# Patient Record
Sex: Male | Born: 1965 | Race: Black or African American | Hispanic: No | Smoking: Never smoker
Health system: Southern US, Community
[De-identification: ages and names within clinical notes are randomized; demographics above are authoritative.]

## PROBLEM LIST (undated history)

## (undated) DIAGNOSIS — R011 Cardiac murmur, unspecified: Secondary | ICD-10-CM

## (undated) HISTORY — PX: TONSILLECTOMY: SUR1361

---

## 2004-05-14 ENCOUNTER — Emergency Department: Payer: Self-pay | Admitting: Emergency Medicine

## 2005-03-27 ENCOUNTER — Other Ambulatory Visit: Payer: Self-pay

## 2005-03-27 ENCOUNTER — Emergency Department: Payer: Self-pay | Admitting: Unknown Physician Specialty

## 2006-12-26 ENCOUNTER — Emergency Department: Payer: Self-pay | Admitting: Emergency Medicine

## 2009-06-19 ENCOUNTER — Emergency Department: Payer: Self-pay | Admitting: Internal Medicine

## 2012-10-23 ENCOUNTER — Emergency Department: Payer: Self-pay | Admitting: Emergency Medicine

## 2012-10-23 LAB — SYNOVIAL CELL COUNT + DIFF, W/ CRYSTALS
Basophil: 0 %
Crystals, Joint Fluid: NONE SEEN
Neutrophils: 24 %
Other Mononuclear Cells: 53 %

## 2016-01-10 ENCOUNTER — Emergency Department: Payer: Self-pay

## 2016-01-10 ENCOUNTER — Emergency Department
Admission: EM | Admit: 2016-01-10 | Discharge: 2016-01-10 | Disposition: A | Discharge status: Home / Self Care | Payer: Self-pay | Attending: Emergency Medicine | Admitting: Emergency Medicine

## 2016-01-10 ENCOUNTER — Encounter: Payer: Self-pay | Admitting: Emergency Medicine

## 2016-01-10 DIAGNOSIS — R202 Paresthesia of skin: Secondary | ICD-10-CM | POA: Insufficient documentation

## 2016-01-10 DIAGNOSIS — R079 Chest pain, unspecified: Secondary | ICD-10-CM | POA: Insufficient documentation

## 2016-01-10 HISTORY — DX: Cardiac murmur, unspecified: R01.1

## 2016-01-10 LAB — BASIC METABOLIC PANEL
ANION GAP: 7 (ref 5–15)
BUN: 9 mg/dL (ref 6–20)
CHLORIDE: 103 mmol/L (ref 101–111)
CO2: 26 mmol/L (ref 22–32)
Calcium: 9 mg/dL (ref 8.9–10.3)
Creatinine, Ser: 1.1 mg/dL (ref 0.61–1.24)
GFR calc Af Amer: >60 mL/min (ref >60)
Glucose, Bld: 98 mg/dL (ref 65–99)
POTASSIUM: 3.6 mmol/L (ref 3.5–5.1)
SODIUM: 136 mmol/L (ref 135–145)

## 2016-01-10 LAB — CBC
HEMATOCRIT: 46.2 % (ref 40.0–52.0)
HEMOGLOBIN: 15.6 g/dL (ref 13.0–18.0)
MCH: 31.3 pg (ref 26.0–34.0)
MCHC: 33.7 g/dL (ref 32.0–36.0)
MCV: 92.9 fL (ref 80.0–100.0)
Platelets: 186 10*3/uL (ref 150–440)
RBC: 4.98 MIL/uL (ref 4.40–5.90)
RDW: 13.4 % (ref 11.5–14.5)
WBC: 4.2 10*3/uL (ref 3.8–10.6)

## 2016-01-10 LAB — TROPONIN I: Troponin I: <0.03 ng/mL (ref <0.03)

## 2016-01-10 NOTE — ED Triage Notes (Signed)
Pt presents to ED with reports of chest pain for approximately one week that radiates to his back. Pt reports left arm numbness began approximately three weeks ago. Pt denies nausea and vomiting. Pt in no apparent distress in triage.

## 2016-01-10 NOTE — ED Provider Notes (Signed)
[facility] Emergency Department Provider Note   ____________________________________________    I have reviewed the triage vital signs and the nursing notes.   HISTORY  Chief Complaint Chest Pain and Arm numbness     HPI Logan Berry is a 50 y.o. male who presents with complaints of chest pressure for approximately month and intermittent tingling in his left arm for the last week. Currently he has no chest pain or tingling. He feels well today. He denies a history of heart disease. He denies shortness of breath. No calf pain or swelling. No pleurisy. No fevers or chills.   Past Medical History:  Diagnosis Date  . Heart murmur     There are no active problems to display for this patient.   Past Surgical History:  Procedure Laterality Date  . TONSILLECTOMY      Prior to Admission medications   Not on File     Allergies Penicillins  No family history on file.  Social History Social History  Substance Use Topics  . Smoking status: Never Smoker  . Smokeless tobacco: Never Used  . Alcohol use No    Review of Systems  Constitutional: No fever/chills Eyes: No visual changes.  ENT: No sore throat. Cardiovascular: As above Respiratory: Denies shortness of breath. Gastrointestinal: No abdominal pain.  No nausea, no vomiting.   Genitourinary: Negative for dysuria. Musculoskeletal: Negative for back pain. Skin: Negative for rash. Neurological: Negative for headaches or weakness  10-point ROS otherwise negative.  ____________________________________________   PHYSICAL EXAM:  VITAL SIGNS: ED Triage Vitals  Enc Vitals Group     BP 01/10/16 1223 (!) 128/93     Pulse Rate 01/10/16 1223 63     Resp 01/10/16 1223 18     Temp 01/10/16 1223 98.2 F (36.8 C)     Temp Source 01/10/16 1223 Oral     SpO2 01/10/16 1223 100 %     Weight 01/10/16 1222 150 lb (68 kg)     Height 01/10/16 1222 5\' 7"  (1.702 m)     Head Circumference --       Peak Flow --      Pain Score 01/10/16 1222 3     Pain Loc --      Pain Edu? --      Excl. in GC? --     Constitutional: Alert and oriented. No acute distress. Pleasant and interactive Eyes: Conjunctivae are normal.   Nose: No congestion/rhinnorhea. Mouth/Throat: Mucous membranes are moist.    Cardiovascular: Normal rate, regular rhythm. Grossly normal heart sounds.  Good peripheral circulation. Respiratory: Normal respiratory effort.  No retractions. Lungs CTAB. Gastrointestinal: Soft and nontender. No distention.  No CVA tenderness. Genitourinary: deferred Musculoskeletal: No lower extremity tenderness nor edema.  Warm and well perfused Neurologic:  Normal speech and language. No gross focal neurologic deficits are appreciated.  Skin:  Skin is warm, dry and intact. No rash noted. Psychiatric: Mood and affect are normal. Speech and behavior are normal.  ____________________________________________   LABS (all labs ordered are listed, but only abnormal results are displayed)  Labs Reviewed  BASIC METABOLIC PANEL  CBC  TROPONIN I   ____________________________________________  EKG  ED ECG REPORT I, Jene EveryKINNER, Casy Brunetto, the attending physician, personally viewed and interpreted this ECG.  Date: 01/10/2016 EKG Time: 12:21 PM Rate: 61 Rhythm: normal sinus rhythm QRS Axis: normal Intervals: normal ST/T Wave abnormalities: normal Conduction Disturbances: none Narrative Interpretation: unremarkable  ____________________________________________  RADIOLOGY  Chest x-ray unremarkable ____________________________________________  PROCEDURES  Procedure(s) performed: No    Critical Care performed: No ____________________________________________   INITIAL IMPRESSION / ASSESSMENT AND PLAN / ED COURSE  Pertinent labs & imaging results that were available during my care of the patient were reviewed by me and considered in my medical decision making (see chart for  details).  Patient with intermittent chest pain over the last month. He is chest pain-free here. His EKG and lab work is reassuring. However certainly patient could be having angina and we discussed how he needs close cardiology follow-up. He is to refrain from strenuous activity until cleared by cardiology. He will take an aspirin per day. He knows to return to the ED if any worsening pain at all.  Clinical Course   ____________________________________________   FINAL CLINICAL IMPRESSION(S) / ED DIAGNOSES  Final diagnoses:  Nonspecific chest pain      NEW MEDICATIONS STARTED DURING THIS VISIT:  There are no discharge medications for this patient.    Note:  This document was prepared using Dragon voice recognition software and may include unintentional dictation errors.    Jene Everyobert Shaelin Lalley, MD 01/10/16 [phone]

## 2017-02-13 IMAGING — CR DG CHEST 2V
1 series · 2 of 2 positions shown · non-contrast
Comparison: 03/27/2005

CLINICAL DATA: Chest tightness for 1 week, right arm numbness for 3
weeks

EXAM:
CHEST  2 VIEW

[Series 1: dg chest 2 view · 0.14mm/px · 2 of 2 slices shown]
[im 1/2]
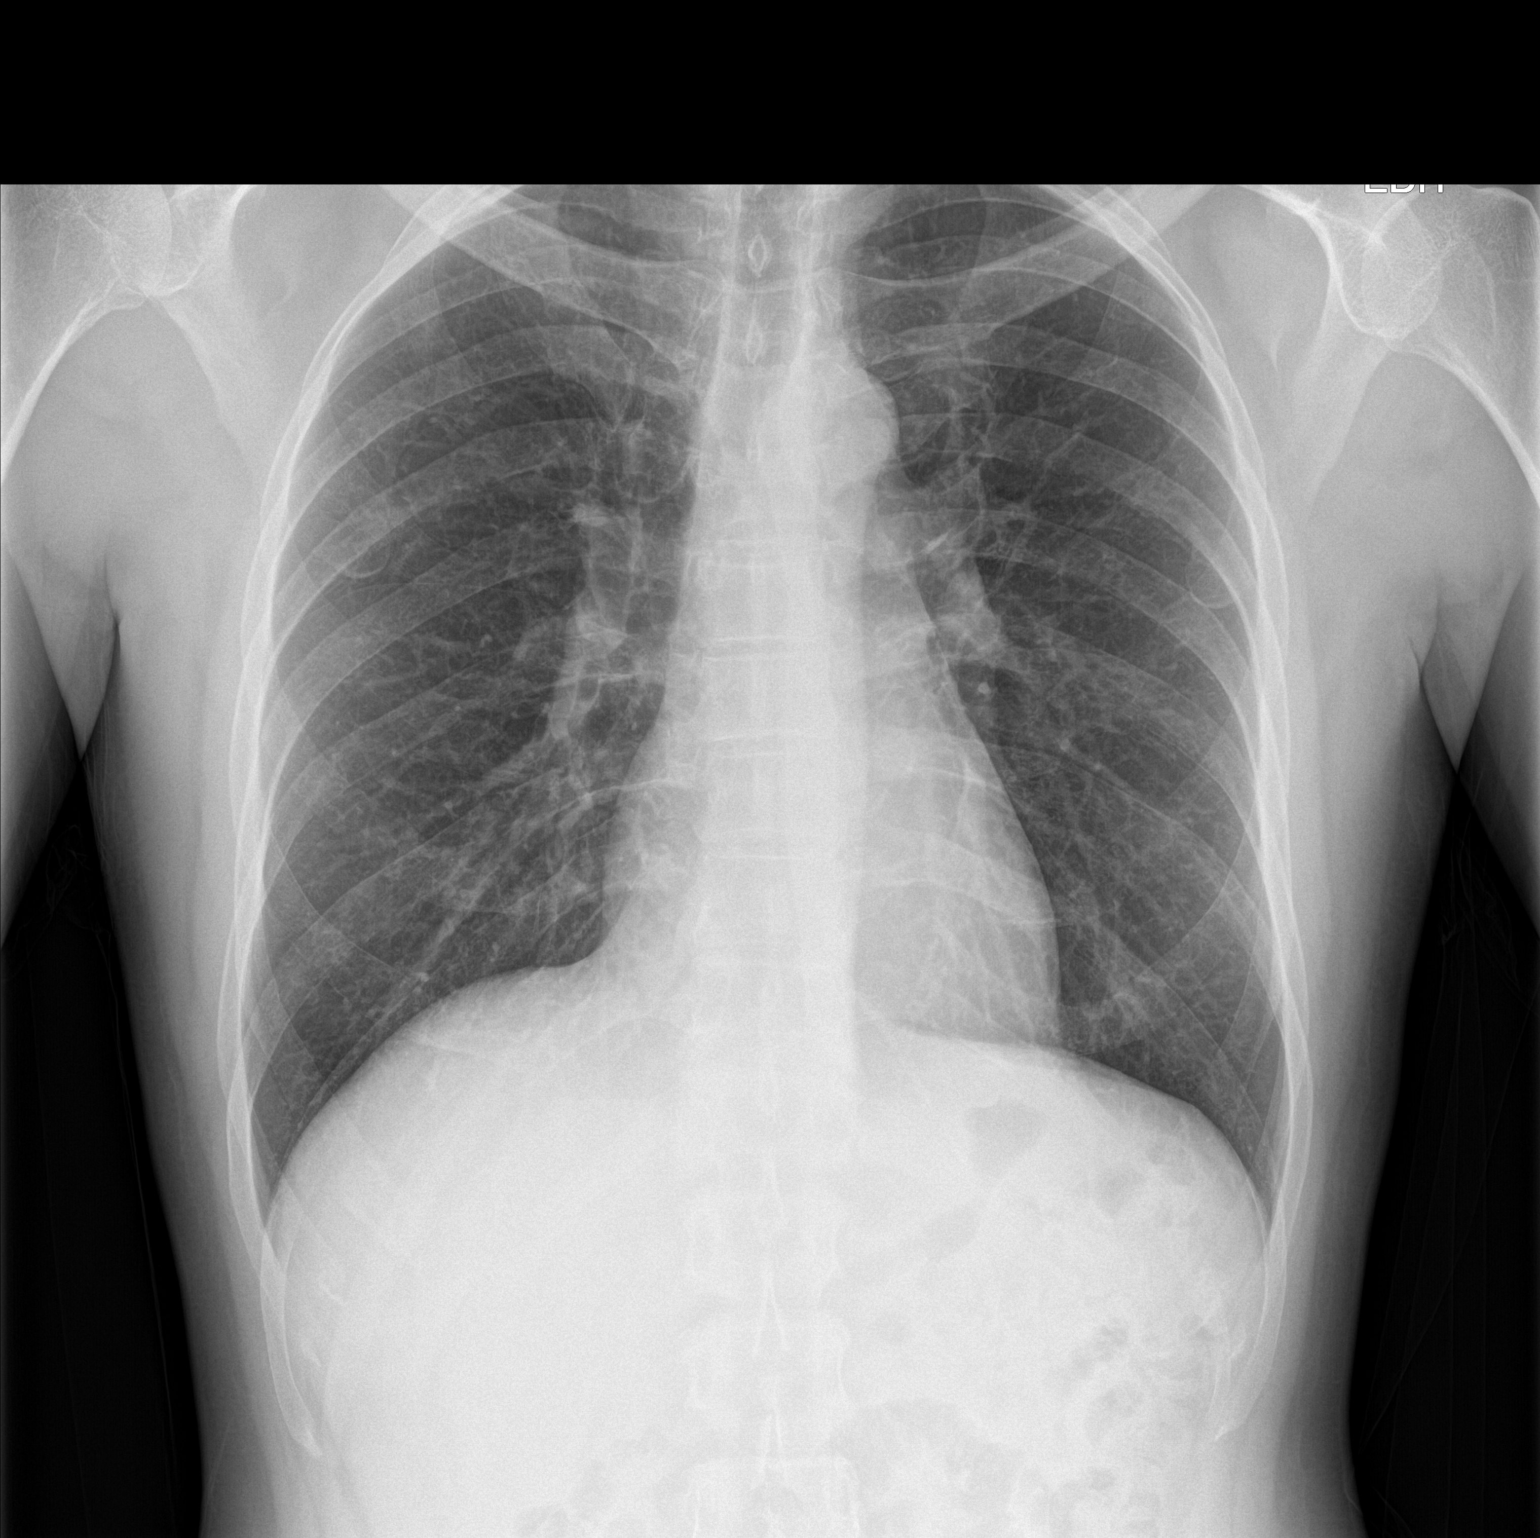
[im 2/2]
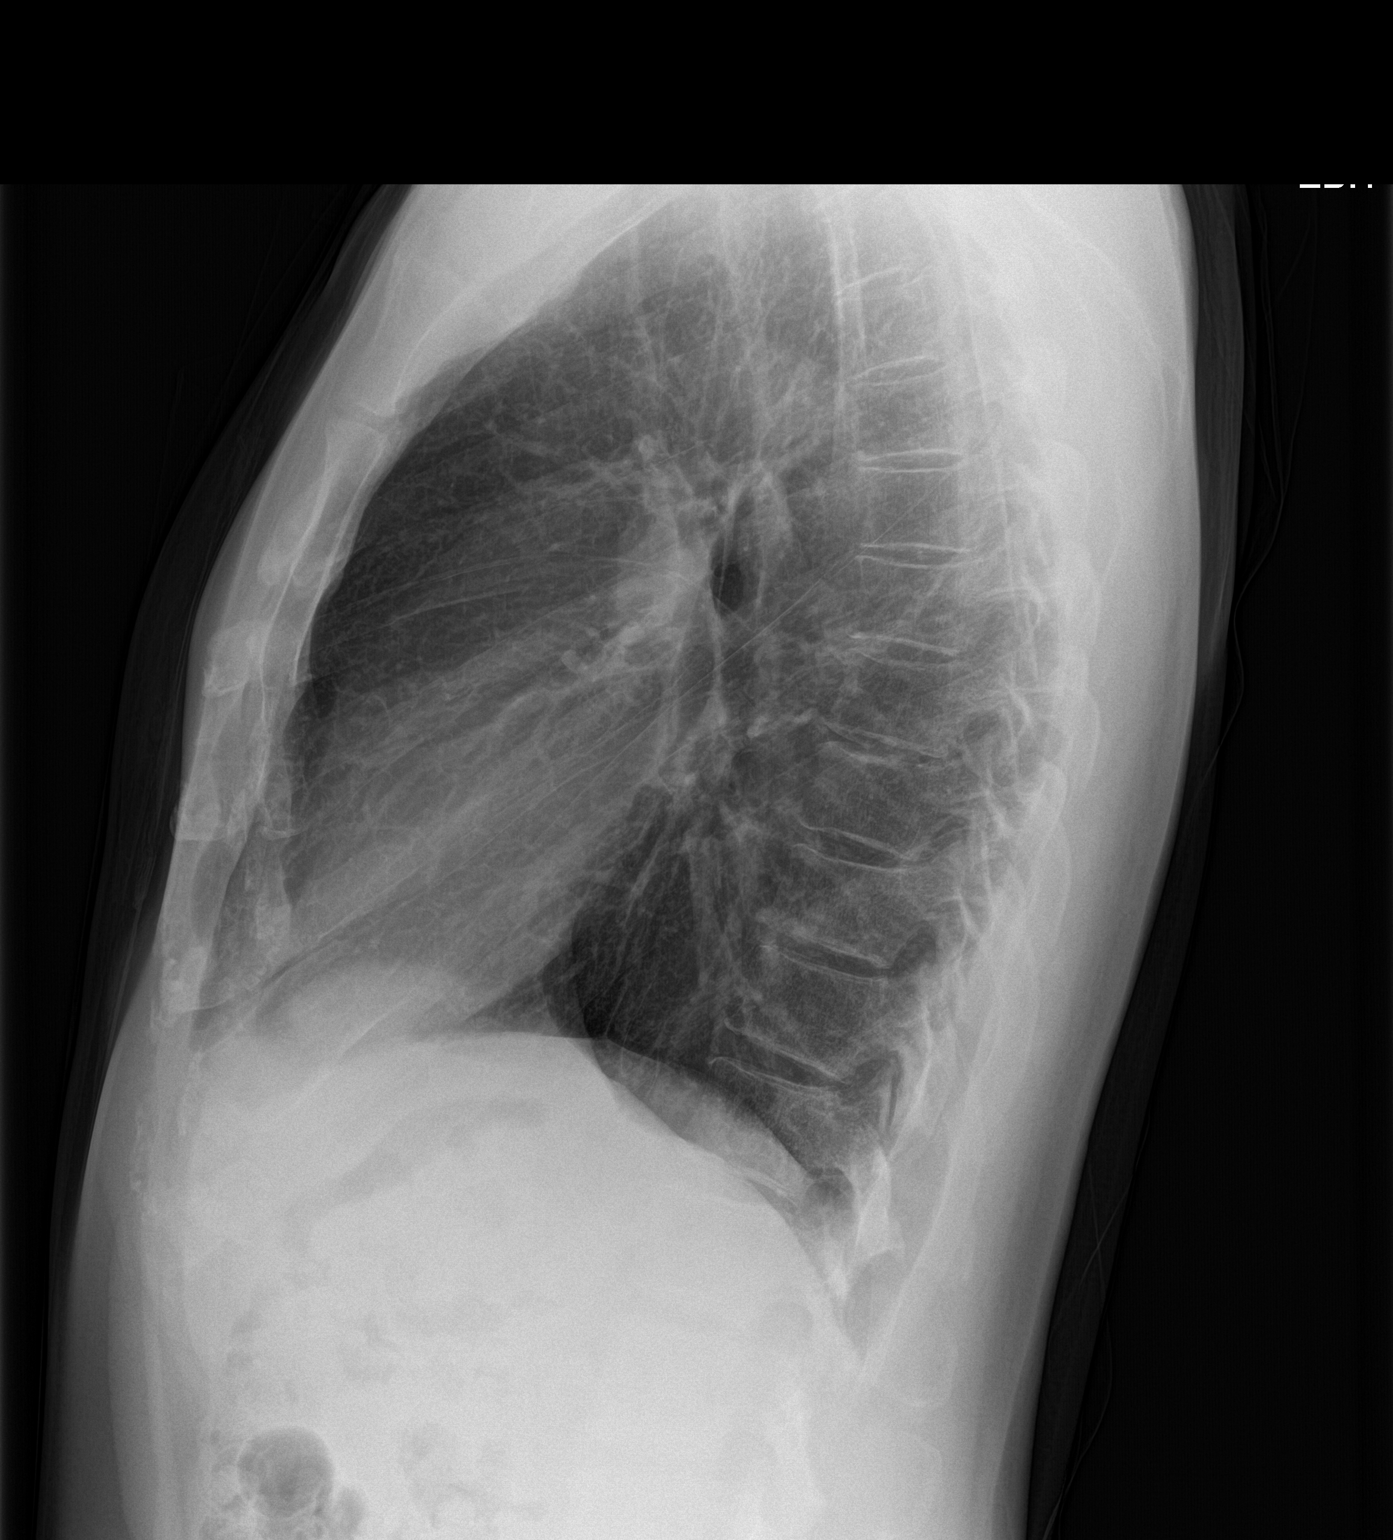

[2 of 2 positions shown; findings below may reference images not displayed]

FINDINGS: The heart size and mediastinal contours are within normal limits.
Both lungs are clear. The visualized skeletal structures are
unremarkable.
IMPRESSION: No active cardiopulmonary disease.

## 2018-07-13 ENCOUNTER — Emergency Department: Payer: HRSA Program

## 2018-07-13 ENCOUNTER — Emergency Department
Admission: EM | Admit: 2018-07-13 | Discharge: 2018-07-14 | Disposition: A | Discharge status: Home / Self Care | Payer: HRSA Program | Attending: Emergency Medicine | Admitting: Emergency Medicine

## 2018-07-13 ENCOUNTER — Other Ambulatory Visit: Payer: Self-pay

## 2018-07-13 ENCOUNTER — Encounter: Payer: Self-pay | Admitting: Emergency Medicine

## 2018-07-13 DIAGNOSIS — R0602 Shortness of breath: Secondary | ICD-10-CM

## 2018-07-13 DIAGNOSIS — J988 Other specified respiratory disorders: Secondary | ICD-10-CM

## 2018-07-13 DIAGNOSIS — U071 COVID-19: Secondary | ICD-10-CM | POA: Diagnosis present

## 2018-07-13 LAB — BASIC METABOLIC PANEL
Anion gap: 10 (ref 5–15)
BUN: 13 mg/dL (ref 6–20)
CO2: 25 mmol/L (ref 22–32)
Calcium: 8.5 mg/dL — ABNORMAL LOW (ref 8.9–10.3)
Chloride: 100 mmol/L (ref 98–111)
Creatinine, Ser: 1.13 mg/dL (ref 0.61–1.24)
GFR calc Af Amer: >60 mL/min (ref >60)
GFR calc non Af Amer: >60 mL/min (ref >60)
Glucose, Bld: 112 mg/dL — ABNORMAL HIGH (ref 70–99)
Potassium: 3.3 mmol/L — ABNORMAL LOW (ref 3.5–5.1)
Sodium: 135 mmol/L (ref 135–145)

## 2018-07-13 LAB — CBC WITH DIFFERENTIAL/PLATELET
Abs Immature Granulocytes: 0.01 10*3/uL (ref 0.00–0.07)
Basophils Absolute: 0 10*3/uL (ref 0.0–0.1)
Basophils Relative: 0 %
Eosinophils Absolute: 0 10*3/uL (ref 0.0–0.5)
Eosinophils Relative: 0 %
HCT: 46.5 % (ref 39.0–52.0)
Hemoglobin: 16.1 g/dL (ref 13.0–17.0)
Immature Granulocytes: 0 %
Lymphocytes Relative: 24 %
Lymphs Abs: 0.8 10*3/uL (ref 0.7–4.0)
MCH: 31.6 pg (ref 26.0–34.0)
MCHC: 34.6 g/dL (ref 30.0–36.0)
MCV: 91.2 fL (ref 80.0–100.0)
Monocytes Absolute: 0.2 10*3/uL (ref 0.1–1.0)
Monocytes Relative: 6 %
Neutro Abs: 2.3 10*3/uL (ref 1.7–7.7)
Neutrophils Relative %: 70 %
Platelets: 121 10*3/uL — ABNORMAL LOW (ref 150–400)
RBC: 5.1 MIL/uL (ref 4.22–5.81)
RDW: 11.8 % (ref 11.5–15.5)
WBC: 3.3 10*3/uL — ABNORMAL LOW (ref 4.0–10.5)
nRBC: 0 % (ref 0.0–0.2)

## 2018-07-13 LAB — TROPONIN I: Troponin I: <0.03 ng/mL (ref <0.03)

## 2018-07-13 MED ORDER — SODIUM CHLORIDE 0.9 % IV BOLUS
1000.0000 mL | Freq: Once | INTRAVENOUS | Status: AC
  Administered 2018-07-13: 22:00:00 1000 mL via INTRAVENOUS

## 2018-07-13 MED ORDER — ONDANSETRON HCL 4 MG/2ML IJ SOLN
4.0000 mg | Freq: Once | INTRAMUSCULAR | Status: AC
  Administered 2018-07-13: 22:00:00 4 mg via INTRAVENOUS
  Filled 2018-07-13: qty 2

## 2018-07-13 MED ORDER — DOXYCYCLINE HYCLATE 100 MG PO CAPS: 100.0000 mg | ORAL_CAPSULE | Freq: Two times a day (BID) | ORAL | 0 refills | Status: AC

## 2018-07-13 MED ORDER — ALBUTEROL SULFATE HFA 108 (90 BASE) MCG/ACT IN AERS
2.0000 | INHALATION_SPRAY | RESPIRATORY_TRACT | Status: DC | PRN
  Administered 2018-07-13: 23:00:00 2 via RESPIRATORY_TRACT
  Filled 2018-07-13: qty 6.7

## 2018-07-13 NOTE — Discharge Instructions (Signed)
Please seek medical attention for any high fevers, chest pain, shortness of breath, change in behavior, persistent vomiting, bloody stool or any other new or concerning symptoms.  

## 2018-07-13 NOTE — ED Provider Notes (Signed)
[facility] Emergency Department Provider Note  ____________________________________________   I have reviewed the triage vital signs and the nursing notes.   HISTORY  Chief Complaint Chest Pain; Shortness of Breath; and Fever   History limited by: Not Limited   HPI Logan Berry is a 53 y.o. male who presents to the emergency department today because of concern for continued chest tightness, shortness of breath and fever. The patient states that he tested positive for covid-19 last week. He had been having similar symptoms at the time of his test. Since then the symptoms have persisted. He was given an antibiotic which he has been using without any significant relief. The patient states he has been trying tylenol for fevers without any relief. Denies any history of copd or asthma.    Records reviewed. Per medical record review patient has a history of covid positive test on 4/29  Past Medical History:  Diagnosis Date  . Heart murmur     There are no active problems to display for this patient.   Past Surgical History:  Procedure Laterality Date  . TONSILLECTOMY      Prior to Admission medications   Medication Sig Start Date End Date Taking? Authorizing Provider  acetaminophen (TYLENOL) 500 MG tablet Take 500 mg by mouth every 6 (six) hours as needed for fever.   Yes [provider]  cefdinir (OMNICEF) 300 MG capsule Take 300 mg by mouth 2 (two) times daily. Take 1 capsule by mouth twice a day for 7 days. 07/09/18 07/16/18 Yes [provider]  ondansetron (ZOFRAN-ODT) 4 MG disintegrating tablet Take 4 mg by mouth every 8 (eight) hours as needed for nausea. 07/09/18   [provider]    Allergies Penicillins  History reviewed. No pertinent family history.  Social History Social History   Tobacco Use  . Smoking status: Never Smoker  . Smokeless tobacco: Never Used  Substance Use Topics  . Alcohol use: No  . Drug use:  Never    Review of Systems Constitutional: Positive for fever. Eyes: No visual changes. ENT: No sore throat. Cardiovascular: Positive for chest tightness. Respiratory: Positive for shortness of breath. Gastrointestinal: No abdominal pain.  Positive for decreased appetite.   Genitourinary: Negative for dysuria. Musculoskeletal: Negative for back pain. Skin: Negative for rash. Neurological: Negative for headaches, focal weakness or numbness.  ____________________________________________   PHYSICAL EXAM:  VITAL SIGNS: ED Triage Vitals  Enc Vitals Group     BP 07/13/18 2046 123/83     Pulse Rate 07/13/18 2046 83     Resp 07/13/18 2046 19     Temp 07/13/18 2046 (!) 101.3 F (38.5 C)     Temp Source 07/13/18 2046 Oral     SpO2 07/13/18 2045 96 %     Weight 07/13/18 2047 145 lb (65.8 kg)     Height 07/13/18 2047 5\' 7"  (1.702 m)     Head Circumference --      Peak Flow --      Pain Score 07/13/18 2047 8   Constitutional: Alert and oriented.  Eyes: Conjunctivae are normal.  ENT      Head: Normocephalic and atraumatic.      Nose: No congestion/rhinnorhea.      Mouth/Throat: Mucous membranes are moist.      Neck: No stridor. Hematological/Lymphatic/Immunilogical: No cervical lymphadenopathy. Cardiovascular: Normal rate, regular rhythm.  No murmurs, rubs, or gallops.  Respiratory: Normal respiratory effort without tachypnea nor retractions. Breath sounds are clear and equal bilaterally.  No wheezes/rales/rhonchi. Gastrointestinal: Soft and non tender. No rebound. No guarding.  Genitourinary: Deferred Musculoskeletal: Normal range of motion in all extremities. No lower extremity edema. Neurologic:  Normal speech and language. No gross focal neurologic deficits are appreciated.  Skin:  Skin is warm, dry and intact. No rash noted. Psychiatric: Mood and affect are normal. Speech and behavior are normal. Patient exhibits appropriate insight and  judgment.  ____________________________________________    LABS (pertinent positives/negatives)  Trop <0.03 CBC wbc 3.3, hgb 16.1, plt 121 BMP wnl except k 3.3, glu 112, ca 8.5  ____________________________________________   EKG  I, Phineas Semen, attending physician, personally viewed and interpreted this EKG  EKG Time: 2055 Rate: 80 Rhythm: normal sinus rhythm Axis: rightward axis Intervals: qtc 435 QRS: narrow ST changes: no st elevation Impression: abnormal ekg  ____________________________________________    RADIOLOGY  CXR Mildly increased interstitial markings  ____________________________________________   PROCEDURES  Procedures  ____________________________________________   INITIAL IMPRESSION / ASSESSMENT AND PLAN / ED COURSE  Pertinent labs & imaging results that were available during my care of the patient were reviewed by me and considered in my medical decision making (see chart for details).   Patient presented to the emergency department today because of concerns for shortness of breath and chest tightness.  Patient did test positive for COVID last week.  Chest x-ray does show some signs consistent with viral infection or atypical infection.  Patient was febrile here.  Patient did feel somewhat better after albuterol inhaler.  At this point I do think symptoms are likely related to COVID.  Will have her put patient on antibiotics. ____________________________________________   FINAL CLINICAL IMPRESSION(S) / ED DIAGNOSES  Final diagnoses:  COVID-19     Note: This dictation was prepared with Dragon dictation. Any transcriptional errors that result from this process are unintentional     Phineas Semen, MD 07/13/18 2348

## 2018-07-13 NOTE — ED Notes (Signed)
Known positive Covid. Diagnosed on Wednesday at [facility]. Here for chest pain

## 2018-07-13 NOTE — ED Notes (Signed)
Pt A&Ox4 at this time.

## 2018-07-13 NOTE — ED Triage Notes (Signed)
Pt arrives via POV from home. Pt c/o mid CP 8/10, accompanied with SHOB when tries to take "deeper breaths", nausea (dry heaving)  Pt st, has had a fever this morning 100.4, pt took tylenol to reduce fever but it did not help. Pt st, he tested positive for Covid-19 at [facility] clinic on Wednesday.

## 2018-07-14 ENCOUNTER — Encounter: Payer: Self-pay | Admitting: Physician Assistant

## 2018-07-14 MED ORDER — ONDANSETRON 8 MG PO TBDP: 8.0000 mg | ORAL_TABLET | Freq: Three times a day (TID) | ORAL | 0 refills | Status: DC | PRN

## 2019-08-17 IMAGING — DX PORTABLE CHEST - 1 VIEW
1 series · 1 of 1 positions shown · non-contrast
Comparison: 01/10/2016 chest radiograph

CLINICAL DATA: 52 y/o M; chest pain, shortness of breath, known
positive [id number].

EXAM:
PORTABLE CHEST 1 VIEW

[chest ap]
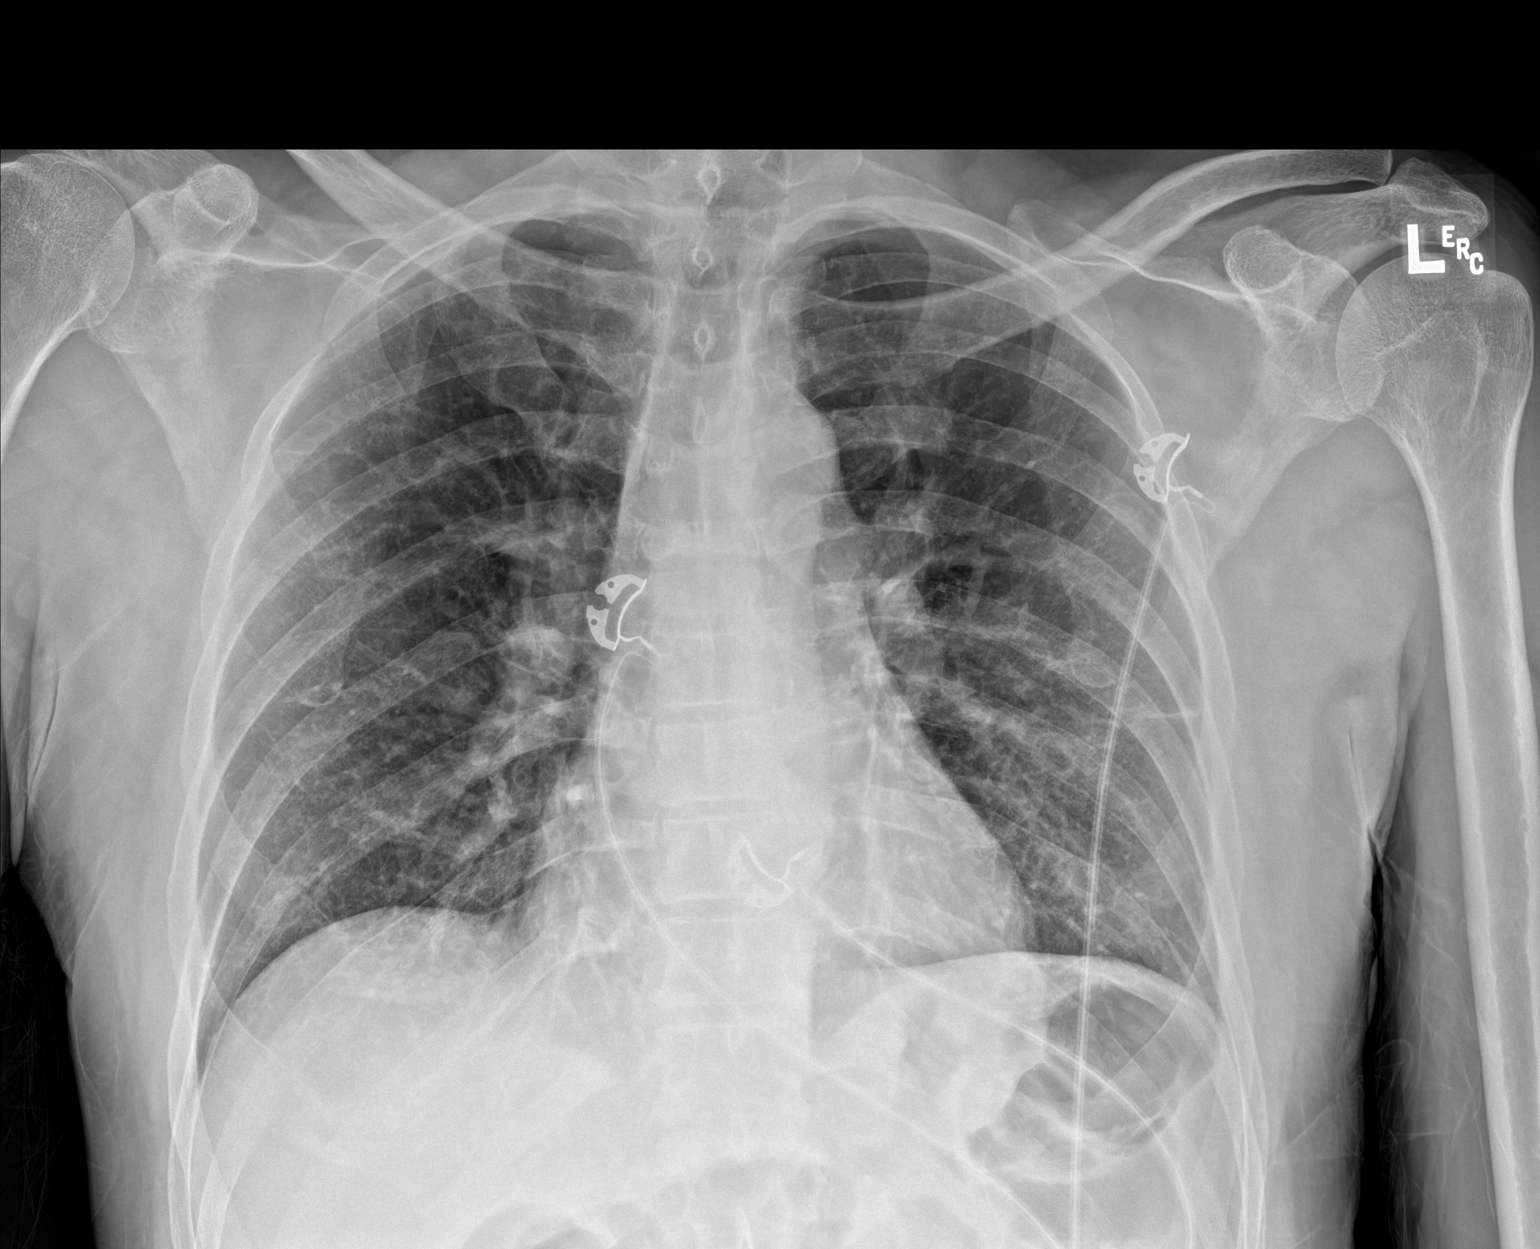

[1 of 1 positions shown; findings below may reference images not displayed]

FINDINGS: Stable normal cardiac silhouette given projection and technique.
Mildly increased interstitial opacities in the lung bases. No
consolidation, effusion, or pneumothorax. Bones are unremarkable.
IMPRESSION: Mildly increased interstitial opacities compatible with
atypical/viral pneumonia. No consolidation.

## 2019-11-03 ENCOUNTER — Ambulatory Visit
Admission: EM | Admit: 2019-11-03 | Discharge: 2019-11-03 | Disposition: A | Discharge status: Home / Self Care | Payer: Self-pay | Attending: Family Medicine | Admitting: Family Medicine

## 2019-11-03 ENCOUNTER — Other Ambulatory Visit: Payer: Self-pay

## 2019-11-03 DIAGNOSIS — L0291 Cutaneous abscess, unspecified: Secondary | ICD-10-CM

## 2019-11-03 DIAGNOSIS — L03116 Cellulitis of left lower limb: Secondary | ICD-10-CM

## 2019-11-03 DIAGNOSIS — W57XXXA Bitten or stung by nonvenomous insect and other nonvenomous arthropods, initial encounter: Secondary | ICD-10-CM

## 2019-11-03 MED ORDER — CEFTRIAXONE SODIUM 1 G IJ SOLR
1.0000 g | Freq: Once | INTRAMUSCULAR | Status: AC
  Administered 2019-11-03: 1 g via INTRAMUSCULAR

## 2019-11-03 MED ORDER — DOXYCYCLINE HYCLATE 100 MG PO CAPS: 100.0000 mg | ORAL_CAPSULE | Freq: Two times a day (BID) | ORAL | 0 refills | Status: DC

## 2019-11-03 MED ORDER — MUPIROCIN 2 % EX OINT: 1.0000 "application " | TOPICAL_OINTMENT | Freq: Three times a day (TID) | CUTANEOUS | 0 refills | Status: DC

## 2019-11-03 NOTE — ED Triage Notes (Signed)
Patient in today w/ c/o insect bite on his left thigh. Patient states he noticed it approx. 2 days ago and it is larger today and "hot." Patient also states he had a fever of 101.1 this a.m.

## 2019-11-03 NOTE — ED Provider Notes (Signed)
[facility]    CSN: [id number] Arrival date & time: 11/03/19  0913      History   Chief Complaint Chief Complaint  Patient presents with  . Insect Bite    Left thigh    HPI Logan Berry is a 54 y.o. male.   HPI  54 year old male presents today with an insect bite on his left thigh that occurred approximately 2 days ago.  There was a small pustular area that has grown in size over the period of time.. Last night he reports a fever of 101.1 noticed this morning but is afebrile.  States the area is very tender to the touch and very warm.  Remember having ant bites when he was working on his air conditioner outside.  Now the area has induration throughout the whole erythematous area particularly over the small pustular area.  There is no fluctuance at this time.        Past Medical History:  Diagnosis Date  . Heart murmur     There are no problems to display for this patient.   Past Surgical History:  Procedure Laterality Date  . TONSILLECTOMY         Home Medications    Prior to Admission medications   Medication Sig Start Date End Date Taking? Authorizing Provider  acetaminophen (TYLENOL) 500 MG tablet Take 500 mg by mouth every 6 (six) hours as needed for fever.   Yes [provider]  doxycycline (VIBRAMYCIN) 100 MG capsule Take 1 capsule (100 mg total) by mouth 2 (two) times daily. 11/03/19   Lutricia Feil, PA-C  mupirocin ointment (BACTROBAN) 2 % Apply 1 application topically 3 (three) times daily. 11/03/19   Lutricia Feil, PA-C  ondansetron (ZOFRAN ODT) 8 MG disintegrating tablet Take 1 tablet (8 mg total) by mouth every 8 (eight) hours as needed. 07/14/18   Dionne Bucy, MD    Family History History reviewed. No pertinent family history.  Social History Social History   Tobacco Use  . Smoking status: Never Smoker  . Smokeless tobacco: Never Used  Vaping Use  . Vaping Use: Never used  Substance Use Topics  . Alcohol  use: No  . Drug use: Never     Allergies   Penicillins   Review of Systems Review of Systems  Constitutional: Positive for activity change and fever. Negative for appetite change, chills, diaphoresis and fatigue.  Skin: Positive for color change and wound.  All other systems reviewed and are negative.    Physical Exam Triage Vital Signs ED Triage Vitals  Enc Vitals Group     BP 11/03/19 0927 112/73     Pulse Rate 11/03/19 0927 78     Resp 11/03/19 0927 18     Temp 11/03/19 0927 98.1 F (36.7 C)     Temp Source 11/03/19 0927 Oral     SpO2 11/03/19 0927 99 %     Weight 11/03/19 0924 140 lb (63.5 kg)     Height 11/03/19 0924 5\' 7"  (1.702 m)     Head Circumference --      Peak Flow --      Pain Score 11/03/19 0924 4     Pain Loc --      Pain Edu? --      Excl. in GC? --    No data found.  Updated Vital Signs BP 112/73 (BP Location: Right Arm)   Pulse 78   Temp 98.1 F (36.7 C) (Oral)   Resp  18   Ht 5\' 7"  (1.702 m)   Wt 140 lb (63.5 kg)   SpO2 99%   BMI 21.93 kg/m   Visual Acuity Right Eye Distance:   Left Eye Distance:   Bilateral Distance:    Right Eye Near:   Left Eye Near:    Bilateral Near:     Physical Exam Vitals and nursing note reviewed.  Constitutional:      General: He is not in acute distress.    Appearance: Normal appearance. He is normal weight. He is not ill-appearing or toxic-appearing.  HENT:     Head: Normocephalic and atraumatic.  Eyes:     Conjunctiva/sclera: Conjunctivae normal.  Musculoskeletal:        General: Normal range of motion.     Cervical back: Normal range of motion and neck supple.  Skin:    General: Skin is warm and dry.     Findings: Erythema and rash present.     Comments: Examination of the left thigh shows a central area of erythema and punctate that is nondraining.  This is very indurated but not fluctuant.  Induration extends out for a large area along with warmth and erythema approximately 8 inches in  diameter.  There is no fluctuance present.  Is afebrile today.  There is no signs of sepsis at this time.  Neurological:     General: No focal deficit present.     Mental Status: He is alert and oriented to person, place, and time.  Psychiatric:        Mood and Affect: Mood normal.        Behavior: Behavior normal.        Thought Content: Thought content normal.        Judgment: Judgment normal.          UC Treatments / Results  Labs (all labs ordered are listed, but only abnormal results are displayed) Labs Reviewed - No data to display  EKG   Radiology No results found.  Procedures Procedures (including critical care time)  Medications Ordered in UC Medications  cefTRIAXone (ROCEPHIN) injection 1 g (1 g Intramuscular Given 11/03/19 1003)    Initial Impression / Assessment and Plan / UC Course  I have reviewed the triage vital signs and the nursing notes.  Pertinent labs & imaging results that were available during my care of the patient were reviewed by me and considered in my medical decision making (see chart for details).   54 year old male sustained a insect bite to his left anterior thigh 2 days ago.  Since that time he has noticed enlargement of the area there with more warmth and tenderness.  Is been no drainage.  I have given him an intramuscular injection of Rocephin 1000mg  for initial antibiotic treatment.  He will be placed on a warm compress program which was outlined in detail.  He will apply this warm compresses 3-4 times daily for 10 minutes at a time.  Then dry the area and apply mupirocin ointment.  He was given a prescription for doxycycline to take twice a day for the next 7 days.  He will be seen here in 2 days to check his progress.  He will also go to the emergency room if it is not improving or worsening.  He will observe for signs of fever or chills which would necessitate a trip to the emergency room.  He left after receiving the Rocephin.  At this  point he does not appear to  be septic.   Final Clinical Impressions(s) / UC Diagnoses   Final diagnoses:  Cellulitis of left lower extremity  Bug bite with infection, initial encounter  Abscess     Discharge Instructions     Wet a washcloth under the faucet and then place in the microwave oven for 10 to 15 seconds.  Place the cloth over the abscess leaving it there for 10 minutes.  Dry the area thoroughly and apply mupirocin ointment to all areas of hardness.  Perform this 3-4 times each and every day until the abscess has resolved.  Be certain to take all of the doxycycline.  If the area appears worsening return to our clinic or go to your primary care physician .  Otherwise follow-up in our clinic in 2 days.    ED Prescriptions    Medication Sig Dispense Auth. Provider   doxycycline (VIBRAMYCIN) 100 MG capsule Take 1 capsule (100 mg total) by mouth 2 (two) times daily. 14 capsule Ovid Curd P, PA-C   mupirocin ointment (BACTROBAN) 2 % Apply 1 application topically 3 (three) times daily. 22 g Lutricia Feil, PA-C     PDMP not reviewed this encounter.   Lutricia Feil, PA-C 11/03/19 1747

## 2019-11-03 NOTE — Discharge Instructions (Addendum)
Wet a washcloth under the faucet and then place in the microwave oven for 10 to 15 seconds.  Place the cloth over the abscess leaving it there for 10 minutes.  Dry the area thoroughly and apply mupirocin ointment to all areas of hardness.  Perform this 3-4 times each and every day until the abscess has resolved.  Be certain to take all of the doxycycline.  If the area appears worsening return to our clinic or go to your primary care physician .  Otherwise follow-up in our clinic in 2 days.

## 2019-11-05 ENCOUNTER — Ambulatory Visit
Admission: EM | Admit: 2019-11-05 | Discharge: 2019-11-05 | Disposition: A | Discharge status: Home / Self Care | Payer: Self-pay | Attending: Family Medicine | Admitting: Family Medicine

## 2019-11-05 ENCOUNTER — Other Ambulatory Visit: Payer: Self-pay

## 2019-11-05 ENCOUNTER — Encounter: Payer: Self-pay | Admitting: Emergency Medicine

## 2019-11-05 DIAGNOSIS — L03116 Cellulitis of left lower limb: Secondary | ICD-10-CM

## 2019-11-05 MED ORDER — SULFAMETHOXAZOLE-TRIMETHOPRIM 800-160 MG PO TABS: 1.0000 | ORAL_TABLET | Freq: Two times a day (BID) | ORAL | 0 refills | Status: AC

## 2019-11-05 NOTE — Discharge Instructions (Signed)
Stop doxycycline.  Start Bactrim.  If no improvement in 48 hours, you need to go to the hospital.

## 2019-11-05 NOTE — ED Triage Notes (Signed)
Patient here for f/u to his insect bite on left upper thigh area.

## 2019-11-05 NOTE — ED Provider Notes (Signed)
[facility]    CSN: [id number] Arrival date & time: 11/05/19  0857      History   Chief Complaint Chief Complaint  Patient presents with  . Insect Bite   HPI  54 year old male presents for reevaluation regarding left upper extremity cellulitis and developing abscess.  Patient seen on 8/24.  Was placed on doxycycline and Bactroban ointment.  Patient reports that he has seen essentially no improvement.  Area still warm to the touch.  Erythema does not seem to be worsening.  No fever.  Denies pain at this time.  No other associated symptoms.  No other complaints  Past Medical History:  Diagnosis Date  . Heart murmur    Past Surgical History:  Procedure Laterality Date  . TONSILLECTOMY      Home Medications    Prior to Admission medications   Medication Sig Start Date End Date Taking? Authorizing Provider  acetaminophen (TYLENOL) 500 MG tablet Take 500 mg by mouth every 6 (six) hours as needed for fever.   Yes [provider]  mupirocin ointment (BACTROBAN) 2 % Apply 1 application topically 3 (three) times daily. 11/03/19  Yes Ovid Curd P, PA-C  ondansetron (ZOFRAN ODT) 8 MG disintegrating tablet Take 1 tablet (8 mg total) by mouth every 8 (eight) hours as needed. 07/14/18  Yes Dionne Bucy, MD  sulfamethoxazole-trimethoprim (BACTRIM DS) 800-160 MG tablet Take 1 tablet by mouth 2 (two) times daily for 10 days. 11/05/19 11/15/19  Tommie Sams, DO   Social History Social History   Tobacco Use  . Smoking status: Never Smoker  . Smokeless tobacco: Never Used  Vaping Use  . Vaping Use: Never used  Substance Use Topics  . Alcohol use: No  . Drug use: Never     Allergies   Penicillins   Review of Systems Review of Systems  Constitutional: Positive for fever.  Skin:       Cellulitis.   Physical Exam Triage Vital Signs ED Triage Vitals  Enc Vitals Group     BP 11/05/19 0955 (!) 130/100     Pulse Rate 11/05/19 0955 72     Resp  11/05/19 0955 18     Temp 11/05/19 0955 98.2 F (36.8 C)     Temp Source 11/05/19 0955 Oral     SpO2 11/05/19 0955 100 %     Weight 11/05/19 0954 139 lb 15.9 oz (63.5 kg)     Height 11/05/19 0954 5\' 7"  (1.702 m)     Head Circumference --      Peak Flow --      Pain Score 11/05/19 0954 0     Pain Loc --      Pain Edu? --      Excl. in GC? --    Updated Vital Signs BP (!) 130/100 (BP Location: Right Arm)   Pulse 72   Temp 98.2 F (36.8 C) (Oral)   Resp 18   Ht 5\' 7"  (1.702 m)   Wt 63.5 kg   SpO2 100%   BMI 21.93 kg/m   Visual Acuity Right Eye Distance:   Left Eye Distance:   Bilateral Distance:    Right Eye Near:   Left Eye Near:    Bilateral Near:     Physical Exam Vitals and nursing note reviewed.  Constitutional:      General: He is not in acute distress.    Appearance: Normal appearance. He is not ill-appearing.  HENT:     Head: Normocephalic  and atraumatic.  Eyes:     General:        Right eye: No discharge.        Left eye: No discharge.     Conjunctiva/sclera: Conjunctivae normal.  Pulmonary:     Effort: Pulmonary effort is normal. No respiratory distress.  Skin:         Comments: Large area of erythema at the labeled location.  There is a more central area that is indurated.  Palpable warmth.  Neurological:     Mental Status: He is alert.  Psychiatric:        Mood and Affect: Mood normal.        Behavior: Behavior normal.    UC Treatments / Results  Labs (all labs ordered are listed, but only abnormal results are displayed) Labs Reviewed - No data to display  EKG   Radiology No results found.  Procedures Procedures (including critical care time)  Medications Ordered in UC Medications - No data to display  Initial Impression / Assessment and Plan / UC Course  I have reviewed the triage vital signs and the nursing notes.  Pertinent labs & imaging results that were available during my care of the patient were reviewed by me and  considered in my medical decision making (see chart for details).    54 year old male presents with cellulitis and developing abscess of his left leg.  Based on the pictures that were in the chart 2 days ago there has been essentially no improvement.  Stopping Doxy.  Starting Bactrim as it will cover both beta-hemolytic strep as well as staph/MRSA.  Advised to go to the hospital if he fails to improve or worsens in the next 48 hours.  Final Clinical Impressions(s) / UC Diagnoses   Final diagnoses:  Cellulitis of left lower extremity     Discharge Instructions     Stop doxycycline.  Start Bactrim.  If no improvement in 48 hours, you need to go to the hospital.   ED Prescriptions    Medication Sig Dispense Auth. Provider   sulfamethoxazole-trimethoprim (BACTRIM DS) 800-160 MG tablet Take 1 tablet by mouth 2 (two) times daily for 10 days. 20 tablet Tommie Sams, DO     PDMP not reviewed this encounter.   Tommie Sams, [place] 11/05/19 1152

## 2023-07-16 ENCOUNTER — Ambulatory Visit
Admission: EM | Admit: 2023-07-16 | Discharge: 2023-07-16 | Disposition: A | Discharge status: Home / Self Care | Payer: Self-pay | Attending: Physician Assistant | Admitting: Physician Assistant

## 2023-07-16 DIAGNOSIS — H6501 Acute serous otitis media, right ear: Secondary | ICD-10-CM

## 2023-07-16 MED ORDER — IPRATROPIUM BROMIDE 0.06 % NA SOLN: 2.0000 | Freq: Four times a day (QID) | NASAL | 0 refills | Status: AC

## 2023-07-16 NOTE — ED Provider Notes (Signed)
 [facility]    CSN: [id number] Arrival date & time: 07/16/23  1201      History   Chief Complaint Chief Complaint  Patient presents with   Ear Fullness    HPI Logan Berry is a 58 y.o. male presenting for 2-week history of right-sided ear fullness, reduced hearing and ear pressure.  Denies drainage from the ear.  Symptoms started about the time he got water in his ear when he was showering.  He denies fever, fatigue.  Has been somewhat congested related to allergies and takes Zyrtec daily.  Denies sinus pain, sore throat.  Has tried over-the-counter eardrops without relief.  HPI  Past Medical History:  Diagnosis Date   Heart murmur     There are no active problems to display for this patient.   Past Surgical History:  Procedure Laterality Date   TONSILLECTOMY         Home Medications    Prior to Admission medications   Medication Sig Start Date End Date Taking? Authorizing Provider  ipratropium (ATROVENT) 0.06 % nasal spray Place 2 sprays into both nostrils 4 (four) times daily. 07/16/23  Yes Floydene Hy, PA-C  acetaminophen (TYLENOL) 500 MG tablet Take 500 mg by mouth every 6 (six) hours as needed for fever.    [provider]    Family History History reviewed. No pertinent family history.  Social History Social History   Tobacco Use   Smoking status: Never   Smokeless tobacco: Never  Vaping Use   Vaping status: Never Used  Substance Use Topics   Alcohol use: No   Drug use: Never     Allergies   Penicillins   Review of Systems Review of Systems  Constitutional:  Negative for fatigue and fever.  HENT:  Positive for congestion, hearing loss and rhinorrhea. Negative for ear discharge, ear pain, sinus pressure, sinus pain and sore throat.   Respiratory:  Negative for cough.   Allergic/Immunologic: Positive for environmental allergies.  Neurological:  Negative for weakness, light-headedness and headaches.  Hematological:   Negative for adenopathy.     Physical Exam Triage Vital Signs ED Triage Vitals  Encounter Vitals Group     BP      Systolic BP Percentile      Diastolic BP Percentile      Pulse      Resp      Temp      Temp src      SpO2      Weight      Height      Head Circumference      Peak Flow      Pain Score      Pain Loc      Pain Education      Exclude from Growth Chart    No data found.  Updated Vital Signs BP 126/86 (BP Location: Right Arm)   Pulse 70   Temp 98.2 F (36.8 C) (Oral)   Resp 15   SpO2 96%    Physical Exam Vitals and nursing note reviewed.  Constitutional:      General: He is not in acute distress.    Appearance: Normal appearance. He is well-developed. He is not ill-appearing.  HENT:     Head: Normocephalic and atraumatic.     Right Ear: Ear canal and external ear normal. A middle ear effusion is present. Tympanic membrane is bulging.     Left Ear: Tympanic membrane, ear canal and external ear normal.  Nose: Congestion present.     Mouth/Throat:     Mouth: Mucous membranes are moist.     Pharynx: Oropharynx is clear.  Eyes:     General: No scleral icterus.    Conjunctiva/sclera: Conjunctivae normal.  Cardiovascular:     Rate and Rhythm: Normal rate and regular rhythm.  Pulmonary:     Effort: Pulmonary effort is normal. No respiratory distress.     Breath sounds: Normal breath sounds.  Musculoskeletal:     Cervical back: Neck supple.  Skin:    General: Skin is warm and dry.     Capillary Refill: Capillary refill takes less than 2 seconds.  Neurological:     General: No focal deficit present.     Mental Status: He is alert. Mental status is at baseline.     Motor: No weakness.     Gait: Gait normal.  Psychiatric:        Mood and Affect: Mood normal.        Behavior: Behavior normal.      UC Treatments / Results  Labs (all labs ordered are listed, but only abnormal results are displayed) Labs Reviewed - No data to  display  EKG   Radiology No results found.  Procedures Procedures (including critical care time)  Medications Ordered in UC Medications - No data to display  Initial Impression / Assessment and Plan / UC Course  I have reviewed the triage vital signs and the nursing notes.  Pertinent labs & imaging results that were available during my care of the patient were reviewed by me and considered in my medical decision making (see chart for details).   58 year old male with history of allergies presents for right ear fullness, pressure and reduced hearing for the past couple weeks.  Has been taking Zyrtec and using over-the-counter eardrops.  Acute serous otitis media.  Advised switching to Zyrtec-D.  Add Atrovent nasal spray.  Warm compresses and Tylenol as needed for discomfort.  Reviewed that this can take some time for the fluid to dry up.  Reviewed return precautions.   Final Clinical Impressions(s) / UC Diagnoses   Final diagnoses:  Right acute serous otitis media, recurrence not specified     Discharge Instructions      -There is no ear infection.  -You have fluid behind your eardrum -Switch to Zyrtec D and begin using the prescribed nasal spray. This can take a couple of weeks to clear up, but if you have fever, pain, ear drainage, increased hearing issues please seek re-evaluation.     ED Prescriptions     Medication Sig Dispense Auth. Provider   ipratropium (ATROVENT) 0.06 % nasal spray Place 2 sprays into both nostrils 4 (four) times daily. 15 mL Floydene Hy, PA-C      PDMP not reviewed this encounter.   Floydene Hy, PA-C 07/16/23 1237

## 2023-07-16 NOTE — Discharge Instructions (Addendum)
-  There is no ear infection.  -You have fluid behind your eardrum -Switch to Zyrtec D and begin using the prescribed nasal spray. This can take a couple of weeks to clear up, but if you have fever, pain, ear drainage, increased hearing issues please seek re-evaluation.

## 2023-07-16 NOTE — ED Triage Notes (Signed)
 Right ear full ness and jaw pain x 2 weeks. Thinks he got water in the ear from the shower.

## 2023-09-13 ENCOUNTER — Ambulatory Visit
Admission: EM | Admit: 2023-09-13 | Discharge: 2023-09-13 | Disposition: A | Discharge status: Home / Self Care | Payer: Self-pay | Attending: Family Medicine | Admitting: Family Medicine

## 2023-09-13 ENCOUNTER — Encounter: Payer: Self-pay | Admitting: Emergency Medicine

## 2023-09-13 DIAGNOSIS — R590 Localized enlarged lymph nodes: Secondary | ICD-10-CM | POA: Insufficient documentation

## 2023-09-13 LAB — CBC WITH DIFFERENTIAL/PLATELET
Abs Immature Granulocytes: 0.02 K/uL (ref 0.00–0.07)
Basophils Absolute: 0.1 K/uL (ref 0.0–0.1)
Basophils Relative: 1 %
Eosinophils Absolute: 0.3 K/uL (ref 0.0–0.5)
Eosinophils Relative: 8 %
HCT: 45.9 % (ref 39.0–52.0)
Hemoglobin: 15.7 g/dL (ref 13.0–17.0)
Immature Granulocytes: 1 %
Lymphocytes Relative: 30 %
Lymphs Abs: 1.2 K/uL (ref 0.7–4.0)
MCH: 31.3 pg (ref 26.0–34.0)
MCHC: 34.2 g/dL (ref 30.0–36.0)
MCV: 91.6 fL (ref 80.0–100.0)
Monocytes Absolute: 0.3 K/uL (ref 0.1–1.0)
Monocytes Relative: 7 %
Neutro Abs: 2.1 K/uL (ref 1.7–7.7)
Neutrophils Relative %: 53 %
Platelets: 221 K/uL (ref 150–400)
RBC: 5.01 MIL/uL (ref 4.22–5.81)
RDW: 12.9 % (ref 11.5–15.5)
WBC: 4 K/uL (ref 4.0–10.5)
nRBC: 0 % (ref 0.0–0.2)

## 2023-09-13 MED ORDER — DOXYCYCLINE HYCLATE 100 MG PO TABS: 100.0000 mg | ORAL_TABLET | Freq: Two times a day (BID) | ORAL | 0 refills | Status: AC

## 2023-09-13 NOTE — ED Triage Notes (Signed)
 Patient reports itchy red rash on his right groin and inner thigh for a month.  Patient denies fevers.

## 2023-09-13 NOTE — ED Provider Notes (Signed)
 [facility]    CSN: [id number] Arrival date & time: 09/13/23  1016      History   Chief Complaint Chief Complaint  Patient presents with   Rash    HPI 58 year old male presents for evaluation of the above.  Patient reports right inguinal rash over the past month.  Has been slowly worsening.  Started with 3 raised areas with erythema and has now worsened with additional areas.  No relief with hydrocortisone.  No fever.  No known inciting factor.  No other complaints.    Home Medications    Prior to Admission medications   Medication Sig Start Date End Date Taking? Authorizing Provider  doxycycline  (VIBRA -TABS) 100 MG tablet Take 1 tablet (100 mg total) by mouth 2 (two) times daily. 09/13/23  Yes Earline Stiner G, DO  acetaminophen (TYLENOL) 500 MG tablet Take 500 mg by mouth every 6 (six) hours as needed for fever.    [provider]  ipratropium (ATROVENT ) 0.06 % nasal spray Place 2 sprays into both nostrils 4 (four) times daily. 07/16/23   Arvis Jolan NOVAK, PA-C    Family History History reviewed. No pertinent family history.  Social History Social History   Tobacco Use   Smoking status: Never   Smokeless tobacco: Never  Vaping Use   Vaping status: Never Used  Substance Use Topics   Alcohol use: No   Drug use: Never     Allergies   Penicillins   Review of Systems Review of Systems  Skin:  Positive for rash.   Physical Exam Triage Vital Signs ED Triage Vitals  Encounter Vitals Group     BP 09/13/23 1056 (!) 145/98     Girls Systolic BP Percentile --      Girls Diastolic BP Percentile --      Boys Systolic BP Percentile --      Boys Diastolic BP Percentile --      Pulse Rate 09/13/23 1056 60     Resp 09/13/23 1056 15     Temp 09/13/23 1056 98.2 F (36.8 C)     Temp Source 09/13/23 1056 Oral     SpO2 09/13/23 1056 97 %     Weight 09/13/23 1054 174 lb (78.9 kg)     Height 09/13/23 1054 5' 7 (1.702 m)     Head Circumference --       Peak Flow --      Pain Score 09/13/23 1054 0     Pain Loc --      Pain Education --      Exclude from Growth Chart --    No data found.  Updated Vital Signs BP (!) 145/98 (BP Location: Right Arm)   Pulse 60   Temp 98.2 F (36.8 C) (Oral)   Resp 15   Ht 5' 7 (1.702 m)   Wt 78.9 kg   SpO2 97%   BMI 27.25 kg/m   Visual Acuity Right Eye Distance:   Left Eye Distance:   Bilateral Distance:    Right Eye Near:   Left Eye Near:    Bilateral Near:     Physical Exam Constitutional:      General: He is not in acute distress.    Appearance: Normal appearance.  HENT:     Head: Normocephalic and atraumatic.  Pulmonary:     Effort: Pulmonary effort is normal. No respiratory distress.  Genitourinary:      Comments: Right inguinal region with palpable firm areas with mild erythema.  Suspect lymphadenopathy. Neurological:     Mental Status: He is alert.      UC Treatments / Results  Labs (all labs ordered are listed, but only abnormal results are displayed) Labs Reviewed  CBC WITH DIFFERENTIAL/PLATELET    EKG   Radiology No results found.  Procedures Procedures (including critical care time)  Medications Ordered in UC Medications - No data to display  Initial Impression / Assessment and Plan / UC Course  I have reviewed the triage vital signs and the nursing notes.  Pertinent labs & imaging results that were available during my care of the patient were reviewed by me and considered in my medical decision making (see chart for details).    58 year old male presents with inguinal lymphadenopathy.  Concern for superficial skin infection which is led to lymphadenopathy.  Placing on doxycycline .  Return precautions given.  Final Clinical Impressions(s) / UC Diagnoses   Final diagnoses:  Inguinal lymphadenopathy     Discharge Instructions      Antibiotic as prescribed.  If persists, please let me know.   ED Prescriptions     Medication Sig Dispense  Auth. Provider   doxycycline  (VIBRA -TABS) 100 MG tablet Take 1 tablet (100 mg total) by mouth 2 (two) times daily. 14 tablet Kael Keetch G, DO      PDMP not reviewed this encounter.   Sana Tessmer G, [place] 09/13/23 1208

## 2023-09-13 NOTE — Discharge Instructions (Signed)
 Antibiotic as prescribed.  If persists, please let me know.
